# Patient Record
Sex: Female | Born: 1950 | Race: White | Hispanic: No | Marital: Married | Smoking: Never smoker
Health system: Southern US, Community
[De-identification: ages and names within clinical notes are randomized; demographics above are authoritative.]

## PROBLEM LIST (undated history)

## (undated) DIAGNOSIS — R202 Paresthesia of skin: Secondary | ICD-10-CM

## (undated) HISTORY — PX: BACK SURGERY: SHX140

## (undated) HISTORY — DX: Paresthesia of skin: R20.2

## (undated) HISTORY — PX: BUNIONECTOMY: SHX129

---

## 1996-07-06 HISTORY — PX: LUMBAR FUSION: SHX111

## 1998-07-03 ENCOUNTER — Other Ambulatory Visit: Admission: RE | Admit: 1998-07-03 | Discharge: 1998-07-03 | Payer: Self-pay | Admitting: Obstetrics & Gynecology

## 1999-08-27 ENCOUNTER — Other Ambulatory Visit: Admission: RE | Admit: 1999-08-27 | Discharge: 1999-08-27 | Payer: Self-pay | Admitting: Obstetrics & Gynecology

## 2000-09-29 ENCOUNTER — Other Ambulatory Visit: Admission: RE | Admit: 2000-09-29 | Discharge: 2000-09-29 | Payer: Self-pay | Admitting: Obstetrics & Gynecology

## 2002-05-19 ENCOUNTER — Other Ambulatory Visit: Admission: RE | Admit: 2002-05-19 | Discharge: 2002-05-19 | Payer: Self-pay | Admitting: Obstetrics & Gynecology

## 2003-07-02 ENCOUNTER — Other Ambulatory Visit: Admission: RE | Admit: 2003-07-02 | Discharge: 2003-07-02 | Payer: Self-pay | Admitting: Obstetrics & Gynecology

## 2013-08-11 ENCOUNTER — Ambulatory Visit: Payer: Self-pay | Admitting: Podiatrist

## 2013-08-18 ENCOUNTER — Ambulatory Visit (INDEPENDENT_AMBULATORY_CARE_PROVIDER_SITE_OTHER): Payer: BC Managed Care – PPO | Admitting: Podiatrist

## 2013-08-18 ENCOUNTER — Ambulatory Visit (INDEPENDENT_AMBULATORY_CARE_PROVIDER_SITE_OTHER): Payer: BC Managed Care – PPO

## 2013-08-18 ENCOUNTER — Encounter: Payer: Self-pay | Admitting: Podiatrist

## 2013-08-18 VITALS — BP 124/76 | HR 80 | Resp 18

## 2013-08-18 DIAGNOSIS — M21619 Bunion of unspecified foot: Secondary | ICD-10-CM

## 2013-08-18 DIAGNOSIS — M2030 Hallux varus (acquired), unspecified foot: Secondary | ICD-10-CM

## 2013-08-18 NOTE — Patient Instructions (Signed)
Pre-Operative Instructions  Congratulations, you have decided to take an important step to improving your quality of life.  You can be assured that the doctors of [facility] will be with you every step of the way.  1. Plan to be at the surgery center/hospital at least 1 (one) hour prior to your scheduled time unless otherwise directed by the surgical center/hospital staff.  You must have a responsible adult accompany you, remain during the surgery and drive you home.  Make sure you have directions to the surgical center/hospital and know how to get there on time. 2. For hospital based surgery you will need to obtain a history and physical form from your family physician within 1 month prior to the date of surgery- we will give you a form for you primary physician.  3. We make every effort to accommodate the date you request for surgery.  There are however, times where surgery dates or times have to be moved.  We will contact you as soon as possible if a change in schedule is required.   4. No Aspirin/Ibuprofen for one week before surgery.  If you are on aspirin, any non-steroidal anti-inflammatory medications (Mobic, Aleve, Ibuprofen) you should stop taking it 7 days prior to your surgery.  You make take Tylenol  For pain prior to surgery.  5. Medications- If you are taking daily heart and blood pressure medications, seizure, reflux, allergy, asthma, anxiety, pain or diabetes medications, make sure the surgery center/hospital is aware before the day of surgery so they may notify you which medications to take or avoid the day of surgery. 6. No food or drink after midnight the night before surgery unless directed otherwise by surgical center/hospital staff. 7. No alcoholic beverages 24 hours prior to surgery.  No smoking 24 hours prior to or 24 hours after surgery. 8. Wear loose pants or shorts- loose enough to fit over bandages, boots, and casts. 9. No slip on shoes, sneakers are best. 10. Bring  your boot with you to the surgery center/hospital.  Also bring crutches or a walker if your physician has prescribed it for you.  If you do not have this equipment, it will be provided for you after surgery. 11. If you have not been contracted by the surgery center/hospital by the day before your surgery, call to confirm the date and time of your surgery. 12. Leave-time from work may vary depending on the type of surgery you have.  Appropriate arrangements should be made prior to surgery with your employer. 13. Prescriptions will be provided immediately following surgery by your doctor.  Have these filled as soon as possible after surgery and take the medication as directed. 14. Remove nail polish on the operative foot. 15. Wash the night before surgery.  The night before surgery wash the foot and leg well with the antibacterial soap provided and water paying special attention to beneath the toenails and in between the toes.  Rinse thoroughly with water and dry well with a towel.  Perform this wash unless told not to do so by your physician.  Enclosed: 1 Ice pack (please put in freezer the night before surgery)   1 Hibiclens skin cleaner   Pre-op Instructions  If you have any questions regarding the instructions, do not hesitate to call our office.  [place]: [address] [place], [place] [address] [phone]  [place]: [address] , [place] [address] [phone]  [place]: [address]  [place], [place] [address] [phone]  Dr. Richard   Tuchman DPM, Dr. Norman Regal DPM Dr. Richard Sikora DPM, Dr. M. Todd Hyatt DPM, Dr. Ashok Sawaya DPM 

## 2013-08-18 NOTE — Progress Notes (Signed)
   Subjective:    Patient ID: Angela Velazquez, female    DOB: Oct 06, 1950, 63 y.o.   MRN: [id number]  HPI Patient presents today for follow up of Hallux Varus of the right foot.  I saw her a year ago and we discussed surgery to repair the deformity.  She presents today stating " I have a bunion on my right and has been hurting for a least 5 years and aching and hurts to wear shoes and red and tender and jammed this toe about a week ago and hurts and may have broken it and I have babyed it."  She relates the toe catches on the floor, rug, etc and she jammed it last week.  She states it actually felt a little better after it started to heal but the deformity still bothers her.      Review of Systems  HENT: Positive for sinus pressure.   Allergic/Immunologic: Positive for environmental allergies.  All other systems reviewed and are negative.       Objective:   Physical Exam GENERAL APPEARANCE: Alert, conversant. Appropriately groomed. No acute distress.  VASCULAR: Pedal pulses palpable at 2/4 DP and PT bilateral.  Capillary refill time is immediate to all digits,  Proximal to distal cooling it warm to warm.  Digital hair growth is present bilateral  NEUROLOGIC: sensation is intact epicritically and protectively to 5.07 monofilament at 5/5 sites bilateral.  Light touch is intact bilateral, vibratory sensation intact bilateral, achilles tendon reflex is intact bilateral.  MUSCULOSKELETAL: hallux varus deformity is present and flexible- right foot.  Medial deviation of 2nd toe and splaying between toes 2 and 3 are present.  Swelling at the hallux ip joint is present and bothersome.  Extensor tendon tightness is present. DERMATOLOGIC: skin color, texture, and turger are within normal limits.  No preulcerative lesions are seen, no interdigital maceration noted.  No open lesions present.  Digital nails are asymptomatic.     Assessment & Plan:  Hallux Varus Deformity- Right Plan: Discussed  conservative versus surgical options. Recommended a Hallux Varus repair right with the use of an anchor system, removal of kwires, possible tendon transfer of extensor Halluces. The consent form was discussed and all three pages were signed and the patient's questions were encouraged and answered to the best of my ability. Risks of the surgery were discussed including but not limited to continued pain, infection, swelling, elevated toe, decreased range of motion,  Or suture or implant reaction. Preoperative instructions were also dispensed to the patient as well as a preoperative surgical pamphlet to go along with the instructions. Surgery will be scheduled at the patients convenience and [facility] outpatient basis. If  any questions or concerns prior to her surgical date She is instructed to call.  She will be in a boot for up to 4 weeks post operatively followed by a darco shoe then wean into a supportive running shoe.  She will not be able to return to athletic activities for 3 months post op.  Patient demonstrates understanding of this conversation and will call if she would like to schedule surgery.

## 2013-08-21 ENCOUNTER — Encounter: Payer: Self-pay | Admitting: *Deleted

## 2013-08-22 ENCOUNTER — Encounter: Payer: Self-pay | Admitting: *Deleted

## 2013-08-23 ENCOUNTER — Ambulatory Visit: Payer: Self-pay | Admitting: *Deleted

## 2013-08-24 ENCOUNTER — Encounter: Payer: Self-pay | Admitting: *Deleted

## 2013-08-25 ENCOUNTER — Ambulatory Visit (INDEPENDENT_AMBULATORY_CARE_PROVIDER_SITE_OTHER): Payer: BC Managed Care – PPO | Admitting: *Deleted

## 2013-08-25 ENCOUNTER — Encounter: Payer: Self-pay | Admitting: *Deleted

## 2013-08-25 DIAGNOSIS — I781 Nevus, non-neoplastic: Secondary | ICD-10-CM

## 2013-08-25 NOTE — Progress Notes (Signed)
X=.3% Sotradecol administered with a 27g butterfly.  Patient received a total of 12cc.  Treated all spiders on both legs and 2 reticulars that were on the left leg. Pt nervous but tol well. Easy access. Will follow prn.  Photos: yes  Compression stockings applied: yes

## 2013-08-30 ENCOUNTER — Ambulatory Visit: Payer: Self-pay | Admitting: *Deleted

## 2013-12-13 ENCOUNTER — Ambulatory Visit: Payer: BC Managed Care – PPO | Admitting: *Deleted

## 2013-12-20 ENCOUNTER — Ambulatory Visit: Payer: BC Managed Care – PPO

## 2013-12-27 ENCOUNTER — Ambulatory Visit: Payer: BC Managed Care – PPO | Admitting: *Deleted

## 2014-05-04 ENCOUNTER — Emergency Department (HOSPITAL_COMMUNITY): Payer: BC Managed Care – PPO

## 2014-05-04 ENCOUNTER — Encounter (HOSPITAL_COMMUNITY): Payer: Self-pay | Admitting: Emergency Medicine

## 2014-05-04 ENCOUNTER — Emergency Department (HOSPITAL_COMMUNITY)
Admission: EM | Admit: 2014-05-04 | Discharge: 2014-05-04 | Disposition: A | Discharge status: Home / Self Care | Payer: BC Managed Care – PPO | Attending: Emergency Medicine | Admitting: Emergency Medicine

## 2014-05-04 DIAGNOSIS — S199XXA Unspecified injury of neck, initial encounter: Secondary | ICD-10-CM | POA: Insufficient documentation

## 2014-05-04 DIAGNOSIS — Y92410 Unspecified street and highway as the place of occurrence of the external cause: Secondary | ICD-10-CM | POA: Diagnosis not present

## 2014-05-04 DIAGNOSIS — S3992XA Unspecified injury of lower back, initial encounter: Secondary | ICD-10-CM | POA: Insufficient documentation

## 2014-05-04 DIAGNOSIS — Y9389 Activity, other specified: Secondary | ICD-10-CM | POA: Diagnosis not present

## 2014-05-04 DIAGNOSIS — Z7951 Long term (current) use of inhaled steroids: Secondary | ICD-10-CM | POA: Diagnosis not present

## 2014-05-04 DIAGNOSIS — Z79899 Other long term (current) drug therapy: Secondary | ICD-10-CM | POA: Diagnosis not present

## 2014-05-04 MED ORDER — HYDROCODONE-ACETAMINOPHEN 5-325 MG PO TABS: 1.0000 | ORAL_TABLET | Freq: Four times a day (QID) | ORAL | Status: DC | PRN

## 2014-05-04 MED ORDER — ACETAMINOPHEN 325 MG PO TABS
650.0000 mg | ORAL_TABLET | Freq: Once | ORAL | Status: AC
  Administered 2014-05-04: 650 mg via ORAL
  Filled 2014-05-04: qty 2
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
## 2014-05-04 NOTE — Discharge Instructions (Signed)
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
## 2014-07-06 HISTORY — PX: CATARACT EXTRACTION, BILATERAL: SHX1313

## 2015-08-05 DIAGNOSIS — Z961 Presence of intraocular lens: Secondary | ICD-10-CM | POA: Diagnosis not present

## 2015-08-05 DIAGNOSIS — H26491 Other secondary cataract, right eye: Secondary | ICD-10-CM | POA: Diagnosis not present

## 2015-08-05 DIAGNOSIS — H40013 Open angle with borderline findings, low risk, bilateral: Secondary | ICD-10-CM | POA: Diagnosis not present

## 2015-08-05 DIAGNOSIS — H04123 Dry eye syndrome of bilateral lacrimal glands: Secondary | ICD-10-CM | POA: Diagnosis not present

## 2015-09-26 DIAGNOSIS — J019 Acute sinusitis, unspecified: Secondary | ICD-10-CM | POA: Diagnosis not present

## 2016-02-11 DIAGNOSIS — R69 Illness, unspecified: Secondary | ICD-10-CM | POA: Diagnosis not present

## 2016-02-26 DIAGNOSIS — Z780 Asymptomatic menopausal state: Secondary | ICD-10-CM | POA: Diagnosis not present

## 2016-02-26 DIAGNOSIS — Z1231 Encounter for screening mammogram for malignant neoplasm of breast: Secondary | ICD-10-CM | POA: Diagnosis not present

## 2016-02-26 DIAGNOSIS — M8589 Other specified disorders of bone density and structure, multiple sites: Secondary | ICD-10-CM | POA: Diagnosis not present

## 2016-02-27 DIAGNOSIS — H40013 Open angle with borderline findings, low risk, bilateral: Secondary | ICD-10-CM | POA: Diagnosis not present

## 2016-02-27 DIAGNOSIS — H04123 Dry eye syndrome of bilateral lacrimal glands: Secondary | ICD-10-CM | POA: Diagnosis not present

## 2016-03-30 DIAGNOSIS — H1132 Conjunctival hemorrhage, left eye: Secondary | ICD-10-CM | POA: Diagnosis not present

## 2016-06-22 DIAGNOSIS — R69 Illness, unspecified: Secondary | ICD-10-CM | POA: Diagnosis not present
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
## 2016-09-16 DIAGNOSIS — H43812 Vitreous degeneration, left eye: Secondary | ICD-10-CM | POA: Diagnosis not present

## 2016-09-16 DIAGNOSIS — H43393 Other vitreous opacities, bilateral: Secondary | ICD-10-CM | POA: Diagnosis not present

## 2016-10-14 DIAGNOSIS — H40013 Open angle with borderline findings, low risk, bilateral: Secondary | ICD-10-CM | POA: Diagnosis not present

## 2016-10-14 DIAGNOSIS — H43393 Other vitreous opacities, bilateral: Secondary | ICD-10-CM | POA: Diagnosis not present

## 2016-10-14 DIAGNOSIS — H43812 Vitreous degeneration, left eye: Secondary | ICD-10-CM | POA: Diagnosis not present

## 2016-10-14 DIAGNOSIS — H04123 Dry eye syndrome of bilateral lacrimal glands: Secondary | ICD-10-CM | POA: Diagnosis not present

## 2016-11-11 DIAGNOSIS — R69 Illness, unspecified: Secondary | ICD-10-CM | POA: Diagnosis not present

## 2016-12-08 DIAGNOSIS — R69 Illness, unspecified: Secondary | ICD-10-CM | POA: Diagnosis not present

## 2016-12-24 DIAGNOSIS — I8312 Varicose veins of left lower extremity with inflammation: Secondary | ICD-10-CM | POA: Diagnosis not present

## 2016-12-24 DIAGNOSIS — I8311 Varicose veins of right lower extremity with inflammation: Secondary | ICD-10-CM | POA: Diagnosis not present

## 2017-01-07 DIAGNOSIS — M79604 Pain in right leg: Secondary | ICD-10-CM | POA: Diagnosis not present

## 2017-01-07 DIAGNOSIS — M79605 Pain in left leg: Secondary | ICD-10-CM | POA: Diagnosis not present

## 2017-02-17 DIAGNOSIS — Z8619 Personal history of other infectious and parasitic diseases: Secondary | ICD-10-CM | POA: Diagnosis not present

## 2017-02-17 DIAGNOSIS — B009 Herpesviral infection, unspecified: Secondary | ICD-10-CM | POA: Diagnosis not present

## 2017-04-21 DIAGNOSIS — R399 Unspecified symptoms and signs involving the genitourinary system: Secondary | ICD-10-CM | POA: Diagnosis not present

## 2017-04-21 DIAGNOSIS — R310 Gross hematuria: Secondary | ICD-10-CM | POA: Diagnosis not present

## 2017-04-26 DIAGNOSIS — R319 Hematuria, unspecified: Secondary | ICD-10-CM | POA: Diagnosis not present

## 2017-04-26 DIAGNOSIS — H40013 Open angle with borderline findings, low risk, bilateral: Secondary | ICD-10-CM | POA: Diagnosis not present

## 2017-04-26 DIAGNOSIS — H43393 Other vitreous opacities, bilateral: Secondary | ICD-10-CM | POA: Diagnosis not present

## 2017-04-26 DIAGNOSIS — R69 Illness, unspecified: Secondary | ICD-10-CM | POA: Diagnosis not present

## 2017-06-07 DIAGNOSIS — R69 Illness, unspecified: Secondary | ICD-10-CM | POA: Diagnosis not present

## 2017-08-16 DIAGNOSIS — Z1231 Encounter for screening mammogram for malignant neoplasm of breast: Secondary | ICD-10-CM | POA: Diagnosis not present

## 2017-09-06 DIAGNOSIS — M7022 Olecranon bursitis, left elbow: Secondary | ICD-10-CM | POA: Diagnosis not present

## 2017-09-06 DIAGNOSIS — R69 Illness, unspecified: Secondary | ICD-10-CM | POA: Diagnosis not present

## 2017-09-06 DIAGNOSIS — M79644 Pain in right finger(s): Secondary | ICD-10-CM | POA: Diagnosis not present

## 2017-11-01 DIAGNOSIS — M79641 Pain in right hand: Secondary | ICD-10-CM | POA: Diagnosis not present

## 2017-11-01 DIAGNOSIS — M25522 Pain in left elbow: Secondary | ICD-10-CM | POA: Diagnosis not present

## 2017-11-01 DIAGNOSIS — M1811 Unilateral primary osteoarthritis of first carpometacarpal joint, right hand: Secondary | ICD-10-CM | POA: Diagnosis not present

## 2017-11-01 DIAGNOSIS — M7022 Olecranon bursitis, left elbow: Secondary | ICD-10-CM | POA: Diagnosis not present

## 2017-11-08 DIAGNOSIS — H43812 Vitreous degeneration, left eye: Secondary | ICD-10-CM | POA: Diagnosis not present

## 2017-11-08 DIAGNOSIS — H43393 Other vitreous opacities, bilateral: Secondary | ICD-10-CM | POA: Diagnosis not present

## 2017-11-08 DIAGNOSIS — H40013 Open angle with borderline findings, low risk, bilateral: Secondary | ICD-10-CM | POA: Diagnosis not present

## 2017-11-08 DIAGNOSIS — H04123 Dry eye syndrome of bilateral lacrimal glands: Secondary | ICD-10-CM | POA: Diagnosis not present

## 2017-12-21 DIAGNOSIS — L720 Epidermal cyst: Secondary | ICD-10-CM | POA: Diagnosis not present

## 2017-12-21 DIAGNOSIS — L219 Seborrheic dermatitis, unspecified: Secondary | ICD-10-CM | POA: Diagnosis not present

## 2017-12-23 DIAGNOSIS — Z124 Encounter for screening for malignant neoplasm of cervix: Secondary | ICD-10-CM | POA: Diagnosis not present

## 2017-12-23 DIAGNOSIS — Z01419 Encounter for gynecological examination (general) (routine) without abnormal findings: Secondary | ICD-10-CM | POA: Diagnosis not present

## 2017-12-28 DIAGNOSIS — R69 Illness, unspecified: Secondary | ICD-10-CM | POA: Diagnosis not present

## 2018-01-17 DIAGNOSIS — Z0000 Encounter for general adult medical examination without abnormal findings: Secondary | ICD-10-CM | POA: Diagnosis not present

## 2018-01-17 DIAGNOSIS — Z13220 Encounter for screening for lipoid disorders: Secondary | ICD-10-CM | POA: Diagnosis not present

## 2018-01-17 DIAGNOSIS — Z1389 Encounter for screening for other disorder: Secondary | ICD-10-CM | POA: Diagnosis not present

## 2018-01-17 DIAGNOSIS — Z23 Encounter for immunization: Secondary | ICD-10-CM | POA: Diagnosis not present

## 2018-02-24 DIAGNOSIS — R140 Abdominal distension (gaseous): Secondary | ICD-10-CM | POA: Diagnosis not present

## 2018-03-18 DIAGNOSIS — S86311A Strain of muscle(s) and tendon(s) of peroneal muscle group at lower leg level, right leg, initial encounter: Secondary | ICD-10-CM | POA: Diagnosis not present

## 2018-03-18 DIAGNOSIS — M25571 Pain in right ankle and joints of right foot: Secondary | ICD-10-CM | POA: Diagnosis not present

## 2018-03-28 DIAGNOSIS — S86311D Strain of muscle(s) and tendon(s) of peroneal muscle group at lower leg level, right leg, subsequent encounter: Secondary | ICD-10-CM | POA: Diagnosis not present

## 2018-05-13 DIAGNOSIS — S86311D Strain of muscle(s) and tendon(s) of peroneal muscle group at lower leg level, right leg, subsequent encounter: Secondary | ICD-10-CM | POA: Diagnosis not present

## 2018-06-30 DIAGNOSIS — R69 Illness, unspecified: Secondary | ICD-10-CM | POA: Diagnosis not present

## 2018-08-01 DIAGNOSIS — S86311D Strain of muscle(s) and tendon(s) of peroneal muscle group at lower leg level, right leg, subsequent encounter: Secondary | ICD-10-CM | POA: Diagnosis not present

## 2018-12-27 DIAGNOSIS — R69 Illness, unspecified: Secondary | ICD-10-CM | POA: Diagnosis not present

## 2019-02-20 DIAGNOSIS — Z13220 Encounter for screening for lipoid disorders: Secondary | ICD-10-CM | POA: Diagnosis not present

## 2019-02-20 DIAGNOSIS — Z23 Encounter for immunization: Secondary | ICD-10-CM | POA: Diagnosis not present

## 2019-02-20 DIAGNOSIS — Z0000 Encounter for general adult medical examination without abnormal findings: Secondary | ICD-10-CM | POA: Diagnosis not present

## 2019-02-20 DIAGNOSIS — Z1389 Encounter for screening for other disorder: Secondary | ICD-10-CM | POA: Diagnosis not present

## 2019-02-20 DIAGNOSIS — Z136 Encounter for screening for cardiovascular disorders: Secondary | ICD-10-CM | POA: Diagnosis not present

## 2019-02-20 DIAGNOSIS — M7022 Olecranon bursitis, left elbow: Secondary | ICD-10-CM | POA: Diagnosis not present

## 2019-03-01 DIAGNOSIS — M7022 Olecranon bursitis, left elbow: Secondary | ICD-10-CM | POA: Diagnosis not present

## 2019-03-01 DIAGNOSIS — M25522 Pain in left elbow: Secondary | ICD-10-CM | POA: Diagnosis not present

## 2019-03-29 DIAGNOSIS — M7022 Olecranon bursitis, left elbow: Secondary | ICD-10-CM | POA: Diagnosis not present

## 2019-03-29 DIAGNOSIS — M25522 Pain in left elbow: Secondary | ICD-10-CM | POA: Diagnosis not present

## 2019-05-25 DIAGNOSIS — R69 Illness, unspecified: Secondary | ICD-10-CM | POA: Diagnosis not present

## 2019-06-20 DIAGNOSIS — L219 Seborrheic dermatitis, unspecified: Secondary | ICD-10-CM | POA: Diagnosis not present

## 2019-06-20 DIAGNOSIS — Z8619 Personal history of other infectious and parasitic diseases: Secondary | ICD-10-CM | POA: Diagnosis not present

## 2019-06-20 DIAGNOSIS — Z23 Encounter for immunization: Secondary | ICD-10-CM | POA: Diagnosis not present

## 2019-06-20 DIAGNOSIS — Z411 Encounter for cosmetic surgery: Secondary | ICD-10-CM | POA: Diagnosis not present

## 2019-11-27 DIAGNOSIS — R69 Illness, unspecified: Secondary | ICD-10-CM | POA: Diagnosis not present

## 2019-12-27 DIAGNOSIS — R69 Illness, unspecified: Secondary | ICD-10-CM | POA: Diagnosis not present

## 2020-02-16 DIAGNOSIS — Z20822 Contact with and (suspected) exposure to covid-19: Secondary | ICD-10-CM | POA: Diagnosis not present

## 2020-02-26 DIAGNOSIS — Z0000 Encounter for general adult medical examination without abnormal findings: Secondary | ICD-10-CM | POA: Diagnosis not present

## 2020-02-26 DIAGNOSIS — Z1389 Encounter for screening for other disorder: Secondary | ICD-10-CM | POA: Diagnosis not present

## 2020-02-26 DIAGNOSIS — Z23 Encounter for immunization: Secondary | ICD-10-CM | POA: Diagnosis not present

## 2020-02-26 DIAGNOSIS — R69 Illness, unspecified: Secondary | ICD-10-CM | POA: Diagnosis not present

## 2020-02-26 DIAGNOSIS — E7800 Pure hypercholesterolemia, unspecified: Secondary | ICD-10-CM | POA: Diagnosis not present

## 2020-02-26 DIAGNOSIS — R29898 Other symptoms and signs involving the musculoskeletal system: Secondary | ICD-10-CM | POA: Diagnosis not present

## 2020-02-26 DIAGNOSIS — R202 Paresthesia of skin: Secondary | ICD-10-CM | POA: Diagnosis not present

## 2020-03-06 ENCOUNTER — Other Ambulatory Visit: Payer: Self-pay

## 2020-03-06 ENCOUNTER — Telehealth: Payer: Self-pay | Admitting: Diagnostic Neuroimaging

## 2020-03-06 ENCOUNTER — Encounter: Payer: Self-pay | Admitting: *Deleted

## 2020-03-06 ENCOUNTER — Ambulatory Visit: Payer: Medicare HMO | Admitting: Diagnostic Neuroimaging

## 2020-03-06 VITALS — BP 150/92 | HR 79 | Ht 64.0 in | Wt 130.0 lb

## 2020-03-06 DIAGNOSIS — R200 Anesthesia of skin: Secondary | ICD-10-CM

## 2020-03-06 DIAGNOSIS — M542 Cervicalgia: Secondary | ICD-10-CM | POA: Diagnosis not present

## 2020-03-06 DIAGNOSIS — R202 Paresthesia of skin: Secondary | ICD-10-CM | POA: Diagnosis not present

## 2020-03-06 NOTE — Progress Notes (Signed)
[facility]  PATIENT: Angela Velazquez DOB: 1950-10-21  REFERRING CLINICIAN: Merri Brunette, MD HISTORY FROM: patient  REASON FOR VISIT: new consult    HISTORICAL  CHIEF COMPLAINT:  Chief Complaint  Patient presents with  . Paresthesia of face    rm 6 New Pt "14 yr h/o left neck weakness, crick in my neck, left arm aching; MRI C spine showed disk degeneration- PT helped; 2-3 months of limited ROM in neck towards left, now I have tingling"    HISTORY OF PRESENT ILLNESS:   69 year old female here for evaluation of neck pain and facial numbness.  Patient has chronic neck pain going back to 2005. Pain is on the left side of the neck radiating to the left shoulder. Patient had MRI of the cervical spine at that time was recommended to pursue conservative management with physical therapy. Symptoms slightly improved but have been ongoing.  3 months ago patient had pain radiating to the left posterior neck, behind her left ear, with numbness and tingling radiating into her left face. Also some numbness in her left anterior neck. More discomfort and pain turning her head to the left side. Symptoms have been increasing over time. She is also had some migraine aura spells seeing sparkles and spots, with some discomfort and photophobia. She has had some rare migraines in her life in the past.   REVIEW OF SYSTEMS: Full 14 system review of systems performed and negative with exception of: As per HPI.  ALLERGIES: Allergies  Allergen Reactions  . Demerol [Meperidine] Nausea Only    HOME MEDICATIONS: Outpatient Medications Prior to Visit  Medication Sig Dispense Refill  . CALCIUM PO Take 1 tablet by mouth every evening.    . Cyanocobalamin (B-12 PO) Take 1 tablet by mouth daily.    Marland Kitchen ibuprofen (ADVIL) 200 MG tablet Take 200 mg by mouth every 6 (six) hours as needed.    . Multiple Vitamin (MULTIVITAMIN) capsule Take 1 capsule by mouth daily. Vit D and Calicum with D/lc     . fluticasone (FLONASE) 50 MCG/ACT nasal spray Place 2 sprays into both nostrils daily.    Marland Kitchen HYDROcodone-acetaminophen (NORCO) 5-325 MG per tablet Take 1 tablet by mouth every 6 (six) hours as needed for moderate pain. 10 tablet 0   No facility-administered medications prior to visit.    PAST MEDICAL HISTORY: Past Medical History:  Diagnosis Date  . Paresthesia    left face    PAST SURGICAL HISTORY: Past Surgical History:  Procedure Laterality Date  . BUNIONECTOMY Right   . CATARACT EXTRACTION, BILATERAL  2016  . CESAREAN SECTION     x2  . CESAREAN SECTION WITH BILATERAL TUBAL LIGATION    . LUMBAR FUSION  1998    FAMILY HISTORY: Family History  Problem Relation Age of Onset  . Stroke Mother   . Hypertension Mother   . Colon cancer Father   . Hypertension Father   . Migraines Sister   . Hypertension Brother   . Hypertension Maternal Grandmother   . Hypertension Maternal Grandfather   . Hypertension Paternal Grandmother   . Hypertension Paternal Grandfather   . Drug abuse Son     SOCIAL HISTORY: Social History   Socioeconomic History  . Marital status: Married    Spouse name: Not on file  . Number of children: 2  . Years of education: Not on file  . Highest education level: Master's degree (e.g., MA, MS, MEng, MEd, MSW, MBA)  Occupational History  Comment: retired Interior and spatial designer of [employer]  Tobacco Use  . Smoking status: Never Smoker  . Smokeless tobacco: Never Used  Substance and Sexual Activity  . Alcohol use: Yes    Comment: 03/06/20 1 beer nightly  . Drug use: No  . Sexual activity: Not on file  Other Topics Concern  . Not on file  Social History Narrative   Lives with husband   Caffeine- coffee 3 c daily   Social Determinants of Health   Financial Resource Strain:   . Difficulty of Paying Living Expenses: Not on file  Food Insecurity:   . Worried About Programme researcher, broadcasting/film/video in the Last Year: Not on file  . Ran Out of Food in the Last  Year: Not on file  Transportation Needs:   . Lack of Transportation (Medical): Not on file  . Lack of Transportation (Non-Medical): Not on file  Physical Activity:   . Days of Exercise per Week: Not on file  . Minutes of Exercise per Session: Not on file  Stress:   . Feeling of Stress : Not on file  Social Connections:   . Frequency of Communication with Friends and Family: Not on file  . Frequency of Social Gatherings with Friends and Family: Not on file  . Attends Religious Services: Not on file  . Active Member of Clubs or Organizations: Not on file  . Attends Banker Meetings: Not on file  . Marital Status: Not on file  Intimate Partner Violence:   . Fear of Current or Ex-Partner: Not on file  . Emotionally Abused: Not on file  . Physically Abused: Not on file  . Sexually Abused: Not on file     PHYSICAL EXAM  GENERAL EXAM/CONSTITUTIONAL: Vitals:  Vitals:   03/06/20 0853  BP: (!) 150/92  Pulse: 79  Weight: 130 lb (59 kg)  Height: 5\' 4"  (1.626 m)     Body mass index is 22.31 kg/m. Wt Readings from Last 3 Encounters:  03/06/20 130 lb (59 kg)     Patient is in no distress; well developed, nourished and groomed; NECK HAS DECR ROM ON TURNING TO LEFT  CARDIOVASCULAR:  Examination of carotid arteries is normal; no carotid bruits  Regular rate and rhythm, no murmurs  Examination of peripheral vascular system by observation and palpation is normal  EYES:  Ophthalmoscopic exam of optic discs and posterior segments is normal; no papilledema or hemorrhages  No exam data present  MUSCULOSKELETAL:  Gait, strength, tone, movements noted in Neurologic exam below  NEUROLOGIC: MENTAL STATUS:  No flowsheet data found.  awake, alert, oriented to person, place and time  recent and remote memory intact  normal attention and concentration  language fluent, comprehension intact, naming intact  fund of knowledge appropriate  CRANIAL NERVE:   2nd  - no papilledema on fundoscopic exam  2nd, 3rd, 4th, 6th - pupils equal and reactive to light, visual fields full to confrontation, extraocular muscles intact, no nystagmus  5th - facial sensation symmetric  7th - facial strength symmetric  8th - hearing intact  9th - palate elevates symmetrically, uvula midline  11th - shoulder shrug symmetric  12th - tongue protrusion midline  MOTOR:   normal bulk and tone, full strength in the BUE, BLE  SENSORY:   normal and symmetric to light touch, temperature, vibration  COORDINATION:   finger-nose-finger, fine finger movements normal  REFLEXES:   deep tendon reflexes present and symmetric  GAIT/STATION:   narrow based gait  DIAGNOSTIC DATA (LABS, IMAGING, TESTING) - I reviewed patient records, labs, notes, testing and imaging myself where available.  No results found for: WBC, HGB, HCT, MCV, PLT No results found for: NA, K, CL, CO2, GLUCOSE, BUN, CREATININE, CALCIUM, PROT, ALBUMIN, AST, ALT, ALKPHOS, BILITOT, GFRNONAA, GFRAA No results found for: CHOL, HDL, LDLCALC, LDLDIRECT, TRIG, CHOLHDL No results found for: LSLH7D No results found for: VITAMINB12 No results found for: TSH   05/04/14 CT head / cervical [I reviewed images myself and agree with interpretation. -VRP]  - Negative noncontrast head CT.  - No evidence of acute cervical spine fracture or subluxation.  Degenerative spondylosis, as described above.     ASSESSMENT AND PLAN  69 y.o. year old female here with:  Dx:  1. Numbness and tingling of left side of face   2. Neck pain on left side       PLAN:  LEFT NECK PAIN / NUMBNESS + LEFT FACE NUMBNESS (new onset x 2-3 months; superimposed on chronic neck pain) - check MRI brain and cervical spine (rule out demyelinating dz, stroke, mass, inflamm)  Orders Placed This Encounter  Procedures  . MR BRAIN W WO CONTRAST  . MR CERVICAL SPINE W WO CONTRAST   Return pending test results, for pending if  symptoms worsen or fail to improve.    Suanne Marker, MD 03/06/2020, 9:21 AM Certified in Neurology, Neurophysiology and Neuroimaging  [facility] [address] [place], [place] [address] [phone]

## 2020-03-06 NOTE — Telephone Encounter (Signed)
[employer] medicare order sent to [facility] . No auth they will reach out to the patient to schedule.

## 2020-03-06 NOTE — Patient Instructions (Signed)
  LEFT NECK PAIN / NUMBNESS / FACE NUMBNESS - check MRI brain and cervical spine

## 2020-03-12 ENCOUNTER — Other Ambulatory Visit: Payer: Self-pay | Admitting: Diagnostic Neuroimaging

## 2020-03-25 ENCOUNTER — Other Ambulatory Visit: Payer: Self-pay | Admitting: Internal Medicine

## 2020-03-25 ENCOUNTER — Ambulatory Visit
Admission: RE | Admit: 2020-03-25 | Discharge: 2020-03-25 | Disposition: A | Discharge status: Home / Self Care | Payer: Medicare HMO | Source: Ambulatory Visit | Attending: Diagnostic Neuroimaging | Admitting: Diagnostic Neuroimaging

## 2020-03-25 ENCOUNTER — Other Ambulatory Visit: Payer: Self-pay

## 2020-03-25 DIAGNOSIS — R200 Anesthesia of skin: Secondary | ICD-10-CM

## 2020-03-25 DIAGNOSIS — M542 Cervicalgia: Secondary | ICD-10-CM

## 2020-03-25 DIAGNOSIS — R202 Paresthesia of skin: Secondary | ICD-10-CM

## 2020-03-25 MED ORDER — GADOBENATE DIMEGLUMINE 529 MG/ML IV SOLN
12.0000 mL | Freq: Once | INTRAVENOUS | Status: AC | PRN
  Administered 2020-03-25: 12 mL via INTRAVENOUS

## 2020-04-01 ENCOUNTER — Telehealth: Payer: Self-pay | Admitting: *Deleted

## 2020-04-01 DIAGNOSIS — R937 Abnormal findings on diagnostic imaging of other parts of musculoskeletal system: Secondary | ICD-10-CM

## 2020-04-01 DIAGNOSIS — R202 Paresthesia of skin: Secondary | ICD-10-CM

## 2020-04-01 DIAGNOSIS — M542 Cervicalgia: Secondary | ICD-10-CM

## 2020-04-01 DIAGNOSIS — R200 Anesthesia of skin: Secondary | ICD-10-CM

## 2020-04-01 NOTE — Telephone Encounter (Signed)
Spoke with patient and informed her the MRI brain scan showed unremarkable imaging results with no acute findings. The MRI cervical spine showed multilevel degenerative changes in cervical spine from C4 down to T1 level. Pinched nerves are noted. She should consider spine surgery consult versus conservative management. She will think about the referral and call back with her decision. Patient verbalized understanding, appreciation.

## 2020-04-02 NOTE — Telephone Encounter (Signed)
Pt called, have questions concerning MRI and the next step. Would like call from the nurse.

## 2020-04-02 NOTE — Telephone Encounter (Addendum)
Called patient who stated she's wondering if there's any value in PT, possible injections, and conservative measures. She asked would that keep it at bay or is it just going to progress so surgery is inevitable? She is asking for Dr [facility] expert opinion.  She does want consult with [facility], any provider, but wants MD input to her questions. I advised will let her know Dr Richrd Humbles reply. Patient verbalized understanding, appreciation.

## 2020-04-02 NOTE — Addendum Note (Signed)
Addended by: [place] Pink on: 04/02/2020 11:48 AM   Modules accepted: Orders

## 2020-04-02 NOTE — Addendum Note (Signed)
Addended by: Colen Darling C on: 04/02/2020 11:11 AM   Modules accepted: Orders

## 2020-04-02 NOTE — Telephone Encounter (Signed)
Spoke with patient and advised her of MD's message. She agreed to PT referral. I advised we will place one; she'll get a call from [facility] in about a week. Patient verbalized understanding, appreciation.

## 2020-04-02 NOTE — Telephone Encounter (Signed)
It is worthwhile to go ahead with PT and get surgical consult. Surgery is not inevitable, but it is better to get the consult and review all options. -VRP

## 2020-04-29 DIAGNOSIS — M4802 Spinal stenosis, cervical region: Secondary | ICD-10-CM | POA: Diagnosis not present

## 2020-04-29 DIAGNOSIS — M47812 Spondylosis without myelopathy or radiculopathy, cervical region: Secondary | ICD-10-CM | POA: Diagnosis not present

## 2020-05-01 ENCOUNTER — Ambulatory Visit: Payer: Medicare HMO | Admitting: Diagnostic Neuroimaging

## 2020-07-15 DIAGNOSIS — H26493 Other secondary cataract, bilateral: Secondary | ICD-10-CM | POA: Diagnosis not present

## 2020-07-15 DIAGNOSIS — Z961 Presence of intraocular lens: Secondary | ICD-10-CM | POA: Diagnosis not present

## 2020-07-15 DIAGNOSIS — H04123 Dry eye syndrome of bilateral lacrimal glands: Secondary | ICD-10-CM | POA: Diagnosis not present

## 2020-07-15 DIAGNOSIS — H40013 Open angle with borderline findings, low risk, bilateral: Secondary | ICD-10-CM | POA: Diagnosis not present

## 2021-02-24 DIAGNOSIS — L299 Pruritus, unspecified: Secondary | ICD-10-CM | POA: Diagnosis not present

## 2021-02-24 DIAGNOSIS — Z411 Encounter for cosmetic surgery: Secondary | ICD-10-CM | POA: Diagnosis not present

## 2021-02-24 DIAGNOSIS — B009 Herpesviral infection, unspecified: Secondary | ICD-10-CM | POA: Diagnosis not present

## 2021-03-17 DIAGNOSIS — R1013 Epigastric pain: Secondary | ICD-10-CM | POA: Diagnosis not present

## 2021-03-17 DIAGNOSIS — Z0000 Encounter for general adult medical examination without abnormal findings: Secondary | ICD-10-CM | POA: Diagnosis not present

## 2021-03-17 DIAGNOSIS — Z1389 Encounter for screening for other disorder: Secondary | ICD-10-CM | POA: Diagnosis not present

## 2021-03-17 DIAGNOSIS — E7800 Pure hypercholesterolemia, unspecified: Secondary | ICD-10-CM | POA: Diagnosis not present

## 2021-03-17 DIAGNOSIS — Z1159 Encounter for screening for other viral diseases: Secondary | ICD-10-CM | POA: Diagnosis not present

## 2021-05-23 DIAGNOSIS — Z1231 Encounter for screening mammogram for malignant neoplasm of breast: Secondary | ICD-10-CM | POA: Diagnosis not present

## 2021-05-23 DIAGNOSIS — Z780 Asymptomatic menopausal state: Secondary | ICD-10-CM | POA: Diagnosis not present

## 2021-07-11 DIAGNOSIS — I872 Venous insufficiency (chronic) (peripheral): Secondary | ICD-10-CM | POA: Diagnosis not present

## 2021-07-11 DIAGNOSIS — I87393 Chronic venous hypertension (idiopathic) with other complications of bilateral lower extremity: Secondary | ICD-10-CM | POA: Diagnosis not present

## 2021-07-22 DIAGNOSIS — I83813 Varicose veins of bilateral lower extremities with pain: Secondary | ICD-10-CM | POA: Diagnosis not present

## 2021-07-22 DIAGNOSIS — M7989 Other specified soft tissue disorders: Secondary | ICD-10-CM | POA: Diagnosis not present

## 2021-07-22 DIAGNOSIS — I87393 Chronic venous hypertension (idiopathic) with other complications of bilateral lower extremity: Secondary | ICD-10-CM | POA: Diagnosis not present

## 2021-07-22 DIAGNOSIS — I872 Venous insufficiency (chronic) (peripheral): Secondary | ICD-10-CM | POA: Diagnosis not present

## 2021-08-04 DIAGNOSIS — H26492 Other secondary cataract, left eye: Secondary | ICD-10-CM | POA: Diagnosis not present

## 2021-08-04 DIAGNOSIS — H43813 Vitreous degeneration, bilateral: Secondary | ICD-10-CM | POA: Diagnosis not present

## 2021-08-04 DIAGNOSIS — H40013 Open angle with borderline findings, low risk, bilateral: Secondary | ICD-10-CM | POA: Diagnosis not present

## 2021-08-04 DIAGNOSIS — H04123 Dry eye syndrome of bilateral lacrimal glands: Secondary | ICD-10-CM | POA: Diagnosis not present

## 2021-11-21 DIAGNOSIS — M7742 Metatarsalgia, left foot: Secondary | ICD-10-CM | POA: Diagnosis not present

## 2021-11-21 DIAGNOSIS — M79672 Pain in left foot: Secondary | ICD-10-CM | POA: Diagnosis not present

## 2021-11-21 DIAGNOSIS — M2031 Hallux varus (acquired), right foot: Secondary | ICD-10-CM | POA: Diagnosis not present

## 2021-11-21 DIAGNOSIS — M21612 Bunion of left foot: Secondary | ICD-10-CM | POA: Diagnosis not present

## 2022-02-23 DIAGNOSIS — L814 Other melanin hyperpigmentation: Secondary | ICD-10-CM | POA: Diagnosis not present

## 2022-02-23 DIAGNOSIS — L818 Other specified disorders of pigmentation: Secondary | ICD-10-CM | POA: Diagnosis not present

## 2022-02-23 DIAGNOSIS — L821 Other seborrheic keratosis: Secondary | ICD-10-CM | POA: Diagnosis not present

## 2022-02-23 DIAGNOSIS — Z411 Encounter for cosmetic surgery: Secondary | ICD-10-CM | POA: Diagnosis not present

## 2022-02-23 DIAGNOSIS — B009 Herpesviral infection, unspecified: Secondary | ICD-10-CM | POA: Diagnosis not present

## 2022-04-13 DIAGNOSIS — Z1331 Encounter for screening for depression: Secondary | ICD-10-CM | POA: Diagnosis not present

## 2022-04-13 DIAGNOSIS — Z0000 Encounter for general adult medical examination without abnormal findings: Secondary | ICD-10-CM | POA: Diagnosis not present

## 2022-04-13 DIAGNOSIS — R69 Illness, unspecified: Secondary | ICD-10-CM | POA: Diagnosis not present

## 2022-04-13 DIAGNOSIS — R030 Elevated blood-pressure reading, without diagnosis of hypertension: Secondary | ICD-10-CM | POA: Diagnosis not present

## 2022-04-13 DIAGNOSIS — F419 Anxiety disorder, unspecified: Secondary | ICD-10-CM | POA: Diagnosis not present

## 2022-04-13 DIAGNOSIS — E7800 Pure hypercholesterolemia, unspecified: Secondary | ICD-10-CM | POA: Diagnosis not present

## 2023-02-22 DIAGNOSIS — M65332 Trigger finger, left middle finger: Secondary | ICD-10-CM | POA: Diagnosis not present

## 2023-02-22 DIAGNOSIS — M79642 Pain in left hand: Secondary | ICD-10-CM | POA: Diagnosis not present

## 2023-02-22 DIAGNOSIS — M79644 Pain in right finger(s): Secondary | ICD-10-CM | POA: Diagnosis not present

## 2023-05-03 DIAGNOSIS — E7800 Pure hypercholesterolemia, unspecified: Secondary | ICD-10-CM | POA: Diagnosis not present

## 2023-07-12 DIAGNOSIS — Z1231 Encounter for screening mammogram for malignant neoplasm of breast: Secondary | ICD-10-CM | POA: Diagnosis not present

## 2023-08-09 DIAGNOSIS — Z411 Encounter for cosmetic surgery: Secondary | ICD-10-CM | POA: Diagnosis not present

## 2023-08-09 DIAGNOSIS — L720 Epidermal cyst: Secondary | ICD-10-CM | POA: Diagnosis not present

## 2023-08-09 DIAGNOSIS — B009 Herpesviral infection, unspecified: Secondary | ICD-10-CM | POA: Diagnosis not present

## 2023-08-20 DIAGNOSIS — M65332 Trigger finger, left middle finger: Secondary | ICD-10-CM | POA: Diagnosis not present

## 2023-10-04 DIAGNOSIS — F4381 Prolonged grief disorder: Secondary | ICD-10-CM | POA: Diagnosis not present

## 2023-10-25 DIAGNOSIS — F4381 Prolonged grief disorder: Secondary | ICD-10-CM | POA: Diagnosis not present

## 2023-11-16 DIAGNOSIS — F4381 Prolonged grief disorder: Secondary | ICD-10-CM | POA: Diagnosis not present

## 2023-12-06 DIAGNOSIS — F4381 Prolonged grief disorder: Secondary | ICD-10-CM | POA: Diagnosis not present

## 2024-02-25 DIAGNOSIS — M2021 Hallux rigidus, right foot: Secondary | ICD-10-CM | POA: Diagnosis not present

## 2024-02-25 DIAGNOSIS — M2031 Hallux varus (acquired), right foot: Secondary | ICD-10-CM | POA: Diagnosis not present

## 2024-03-02 DIAGNOSIS — F4381 Prolonged grief disorder: Secondary | ICD-10-CM | POA: Diagnosis not present

## 2024-03-28 DIAGNOSIS — D122 Benign neoplasm of ascending colon: Secondary | ICD-10-CM | POA: Diagnosis not present

## 2024-03-28 DIAGNOSIS — K648 Other hemorrhoids: Secondary | ICD-10-CM | POA: Diagnosis not present

## 2024-03-28 DIAGNOSIS — Z860101 Personal history of adenomatous and serrated colon polyps: Secondary | ICD-10-CM | POA: Diagnosis not present

## 2024-03-28 DIAGNOSIS — Z09 Encounter for follow-up examination after completed treatment for conditions other than malignant neoplasm: Secondary | ICD-10-CM | POA: Diagnosis not present

## 2024-03-30 DIAGNOSIS — D122 Benign neoplasm of ascending colon: Secondary | ICD-10-CM | POA: Diagnosis not present

## 2024-04-03 DIAGNOSIS — F4381 Prolonged grief disorder: Secondary | ICD-10-CM | POA: Diagnosis not present

## 2024-05-03 DIAGNOSIS — M5416 Radiculopathy, lumbar region: Secondary | ICD-10-CM | POA: Diagnosis not present

## 2024-05-03 DIAGNOSIS — M79652 Pain in left thigh: Secondary | ICD-10-CM | POA: Diagnosis not present

## 2024-05-03 DIAGNOSIS — M7072 Other bursitis of hip, left hip: Secondary | ICD-10-CM | POA: Diagnosis not present

## 2024-05-15 DIAGNOSIS — E7800 Pure hypercholesterolemia, unspecified: Secondary | ICD-10-CM | POA: Diagnosis not present

## 2024-05-15 DIAGNOSIS — M5416 Radiculopathy, lumbar region: Secondary | ICD-10-CM | POA: Diagnosis not present

## 2024-05-29 DIAGNOSIS — Z1331 Encounter for screening for depression: Secondary | ICD-10-CM | POA: Diagnosis not present

## 2024-05-29 DIAGNOSIS — M545 Low back pain, unspecified: Secondary | ICD-10-CM | POA: Diagnosis not present

## 2024-05-29 DIAGNOSIS — Z0000 Encounter for general adult medical examination without abnormal findings: Secondary | ICD-10-CM | POA: Diagnosis not present

## 2024-05-29 DIAGNOSIS — I10 Essential (primary) hypertension: Secondary | ICD-10-CM | POA: Diagnosis not present

## 2024-05-29 DIAGNOSIS — E7800 Pure hypercholesterolemia, unspecified: Secondary | ICD-10-CM | POA: Diagnosis not present

## 2024-05-30 DIAGNOSIS — M5416 Radiculopathy, lumbar region: Secondary | ICD-10-CM | POA: Diagnosis not present

## 2024-05-30 DIAGNOSIS — M7072 Other bursitis of hip, left hip: Secondary | ICD-10-CM | POA: Diagnosis not present

## 2024-05-30 DIAGNOSIS — M5459 Other low back pain: Secondary | ICD-10-CM | POA: Diagnosis not present

## 2024-06-07 DIAGNOSIS — M5459 Other low back pain: Secondary | ICD-10-CM | POA: Diagnosis not present

## 2024-06-16 DIAGNOSIS — M7138 Other bursal cyst, other site: Secondary | ICD-10-CM | POA: Diagnosis not present

## 2024-06-16 DIAGNOSIS — M4726 Other spondylosis with radiculopathy, lumbar region: Secondary | ICD-10-CM | POA: Diagnosis not present
